# Patient Record
Sex: Female | Born: 1960 | Race: White | Hispanic: No | Marital: Single | Smoking: Current every day smoker
Health system: Southern US, Community
[De-identification: ages and names within clinical notes are randomized; demographics above are authoritative.]

## PROBLEM LIST (undated history)

## (undated) DIAGNOSIS — K219 Gastro-esophageal reflux disease without esophagitis: Secondary | ICD-10-CM

## (undated) DIAGNOSIS — F32A Depression, unspecified: Secondary | ICD-10-CM

## (undated) DIAGNOSIS — F419 Anxiety disorder, unspecified: Secondary | ICD-10-CM

## (undated) DIAGNOSIS — K760 Fatty (change of) liver, not elsewhere classified: Secondary | ICD-10-CM

## (undated) DIAGNOSIS — F329 Major depressive disorder, single episode, unspecified: Secondary | ICD-10-CM

## (undated) DIAGNOSIS — I10 Essential (primary) hypertension: Secondary | ICD-10-CM

## (undated) DIAGNOSIS — E7800 Pure hypercholesterolemia, unspecified: Secondary | ICD-10-CM

## (undated) DIAGNOSIS — J449 Chronic obstructive pulmonary disease, unspecified: Secondary | ICD-10-CM

## (undated) DIAGNOSIS — G4733 Obstructive sleep apnea (adult) (pediatric): Secondary | ICD-10-CM

## (undated) DIAGNOSIS — K635 Polyp of colon: Secondary | ICD-10-CM

## (undated) DIAGNOSIS — K5790 Diverticulosis of intestine, part unspecified, without perforation or abscess without bleeding: Secondary | ICD-10-CM

## (undated) HISTORY — PX: BACK SURGERY: SHX140

## (undated) HISTORY — DX: Fatty (change of) liver, not elsewhere classified: K76.0

## (undated) HISTORY — DX: Chronic obstructive pulmonary disease, unspecified: J44.9

## (undated) HISTORY — DX: Gastro-esophageal reflux disease without esophagitis: K21.9

## (undated) HISTORY — DX: Major depressive disorder, single episode, unspecified: F32.9

## (undated) HISTORY — DX: Depression, unspecified: F32.A

## (undated) HISTORY — DX: Essential (primary) hypertension: I10

## (undated) HISTORY — DX: Anxiety disorder, unspecified: F41.9

## (undated) HISTORY — DX: Diverticulosis of intestine, part unspecified, without perforation or abscess without bleeding: K57.90

## (undated) HISTORY — PX: TONGUE BIOPSY / EXCISION: SUR125

## (undated) HISTORY — PX: ABDOMINAL HYSTERECTOMY: SHX81

## (undated) HISTORY — DX: Polyp of colon: K63.5

## (undated) HISTORY — DX: Obstructive sleep apnea (adult) (pediatric): G47.33

## (undated) HISTORY — DX: Pure hypercholesterolemia, unspecified: E78.00

---

## 1998-03-15 ENCOUNTER — Emergency Department (HOSPITAL_COMMUNITY): Admission: EM | Admit: 1998-03-15 | Discharge: 1998-03-15 | Payer: Self-pay | Admitting: Emergency Medicine

## 2001-10-08 ENCOUNTER — Encounter: Payer: Self-pay | Admitting: Family Medicine

## 2001-10-08 ENCOUNTER — Ambulatory Visit (HOSPITAL_COMMUNITY): Admission: RE | Admit: 2001-10-08 | Discharge: 2001-10-08 | Payer: Self-pay | Admitting: Family Medicine

## 2002-06-08 ENCOUNTER — Inpatient Hospital Stay (HOSPITAL_COMMUNITY): Admission: AC | Admit: 2002-06-08 | Discharge: 2002-07-05 | Payer: Self-pay

## 2002-06-08 ENCOUNTER — Encounter: Payer: Self-pay | Admitting: Emergency Medicine

## 2002-06-09 ENCOUNTER — Encounter: Payer: Self-pay | Admitting: General Surgery

## 2002-06-10 ENCOUNTER — Encounter: Payer: Self-pay | Admitting: Neurosurgery

## 2002-06-11 ENCOUNTER — Encounter: Payer: Self-pay | Admitting: General Surgery

## 2002-06-11 ENCOUNTER — Encounter: Payer: Self-pay | Admitting: Neurosurgery

## 2002-06-11 ENCOUNTER — Encounter: Payer: Self-pay | Admitting: Surgery

## 2002-06-12 ENCOUNTER — Encounter: Payer: Self-pay | Admitting: General Surgery

## 2002-06-13 ENCOUNTER — Encounter: Payer: Self-pay | Admitting: General Surgery

## 2002-06-14 ENCOUNTER — Encounter: Payer: Self-pay | Admitting: General Surgery

## 2002-06-15 ENCOUNTER — Encounter: Payer: Self-pay | Admitting: General Surgery

## 2002-06-16 ENCOUNTER — Encounter: Payer: Self-pay | Admitting: General Surgery

## 2002-06-17 ENCOUNTER — Encounter: Payer: Self-pay | Admitting: General Surgery

## 2002-06-18 ENCOUNTER — Encounter: Payer: Self-pay | Admitting: General Surgery

## 2002-06-19 ENCOUNTER — Encounter: Payer: Self-pay | Admitting: General Surgery

## 2002-06-20 ENCOUNTER — Encounter: Payer: Self-pay | Admitting: General Surgery

## 2002-06-21 ENCOUNTER — Encounter: Payer: Self-pay | Admitting: General Surgery

## 2002-06-22 ENCOUNTER — Encounter: Payer: Self-pay | Admitting: General Surgery

## 2002-06-23 ENCOUNTER — Encounter: Payer: Self-pay | Admitting: General Surgery

## 2002-06-24 ENCOUNTER — Encounter: Payer: Self-pay | Admitting: General Surgery

## 2002-06-25 ENCOUNTER — Encounter: Payer: Self-pay | Admitting: Thoracic Surgery

## 2002-06-26 ENCOUNTER — Encounter: Payer: Self-pay | Admitting: Neurosurgery

## 2002-06-26 ENCOUNTER — Encounter: Payer: Self-pay | Admitting: General Surgery

## 2002-06-28 ENCOUNTER — Encounter: Payer: Self-pay | Admitting: Neurosurgery

## 2002-06-29 ENCOUNTER — Encounter: Payer: Self-pay | Admitting: General Surgery

## 2002-06-30 ENCOUNTER — Encounter: Payer: Self-pay | Admitting: General Surgery

## 2002-07-05 ENCOUNTER — Inpatient Hospital Stay (HOSPITAL_COMMUNITY)
Admission: RE | Admit: 2002-07-05 | Discharge: 2002-07-19 | Payer: Self-pay | Admitting: Physical Medicine & Rehabilitation

## 2002-11-07 ENCOUNTER — Encounter
Admission: RE | Admit: 2002-11-07 | Discharge: 2003-02-05 | Payer: Self-pay | Admitting: Physical Medicine & Rehabilitation

## 2003-01-31 ENCOUNTER — Ambulatory Visit (HOSPITAL_COMMUNITY): Admission: RE | Admit: 2003-01-31 | Discharge: 2003-01-31 | Payer: Self-pay | Admitting: Neurosurgery

## 2003-01-31 ENCOUNTER — Encounter: Payer: Self-pay | Admitting: Neurosurgery

## 2003-02-03 ENCOUNTER — Encounter
Admission: RE | Admit: 2003-02-03 | Discharge: 2003-05-04 | Payer: Self-pay | Admitting: Physical Medicine & Rehabilitation

## 2003-07-16 ENCOUNTER — Encounter
Admission: RE | Admit: 2003-07-16 | Discharge: 2003-10-14 | Payer: Self-pay | Admitting: Physical Medicine & Rehabilitation

## 2003-09-18 ENCOUNTER — Emergency Department (HOSPITAL_COMMUNITY): Admission: EM | Admit: 2003-09-18 | Discharge: 2003-09-18 | Payer: Self-pay | Admitting: Emergency Medicine

## 2003-10-16 ENCOUNTER — Encounter
Admission: RE | Admit: 2003-10-16 | Discharge: 2004-01-14 | Payer: Self-pay | Admitting: Physical Medicine & Rehabilitation

## 2004-02-04 ENCOUNTER — Encounter
Admission: RE | Admit: 2004-02-04 | Discharge: 2004-03-22 | Payer: Self-pay | Admitting: Physical Medicine & Rehabilitation

## 2004-03-22 ENCOUNTER — Encounter
Admission: RE | Admit: 2004-03-22 | Discharge: 2004-06-20 | Payer: Self-pay | Admitting: Physical Medicine & Rehabilitation

## 2004-03-23 ENCOUNTER — Ambulatory Visit: Payer: Self-pay | Admitting: Physical Medicine & Rehabilitation

## 2004-06-21 ENCOUNTER — Encounter
Admission: RE | Admit: 2004-06-21 | Discharge: 2004-09-17 | Payer: Self-pay | Admitting: Physical Medicine & Rehabilitation

## 2004-09-17 ENCOUNTER — Encounter
Admission: RE | Admit: 2004-09-17 | Discharge: 2004-12-16 | Payer: Self-pay | Admitting: Physical Medicine & Rehabilitation

## 2004-11-19 ENCOUNTER — Ambulatory Visit: Payer: Self-pay | Admitting: Physical Medicine & Rehabilitation

## 2005-02-09 ENCOUNTER — Encounter
Admission: RE | Admit: 2005-02-09 | Discharge: 2005-05-10 | Payer: Self-pay | Admitting: Physical Medicine & Rehabilitation

## 2005-02-09 ENCOUNTER — Ambulatory Visit: Payer: Self-pay | Admitting: Physical Medicine & Rehabilitation

## 2005-04-26 ENCOUNTER — Ambulatory Visit: Payer: Self-pay | Admitting: Physical Medicine & Rehabilitation

## 2005-05-02 ENCOUNTER — Ambulatory Visit (HOSPITAL_COMMUNITY)
Admission: RE | Admit: 2005-05-02 | Discharge: 2005-05-02 | Payer: Self-pay | Admitting: Physical Medicine & Rehabilitation

## 2005-07-19 ENCOUNTER — Encounter
Admission: RE | Admit: 2005-07-19 | Discharge: 2005-10-17 | Payer: Self-pay | Admitting: Physical Medicine & Rehabilitation

## 2005-07-19 ENCOUNTER — Ambulatory Visit: Payer: Self-pay | Admitting: Physical Medicine & Rehabilitation

## 2005-10-13 ENCOUNTER — Encounter
Admission: RE | Admit: 2005-10-13 | Discharge: 2006-01-11 | Payer: Self-pay | Admitting: Physical Medicine & Rehabilitation

## 2005-10-25 ENCOUNTER — Ambulatory Visit: Payer: Self-pay | Admitting: Physical Medicine & Rehabilitation

## 2006-03-24 ENCOUNTER — Encounter
Admission: RE | Admit: 2006-03-24 | Discharge: 2006-06-22 | Payer: Self-pay | Admitting: Physical Medicine & Rehabilitation

## 2009-03-07 ENCOUNTER — Emergency Department (HOSPITAL_COMMUNITY): Admission: EM | Admit: 2009-03-07 | Discharge: 2009-03-07 | Payer: Self-pay | Admitting: Emergency Medicine
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
## 2010-11-26 NOTE — Op Note (Signed)
NAMEJASZMINE, [name]                            ACCOUNT NO.:  [id number]   MEDICAL RECORD NO.:  [id number]                   PATIENT TYPE:  INP   LOCATION:  2316                                 FACILITY:  [facility]   PHYSICIAN:  Jimmye Norman III, M.D.               DATE OF BIRTH:  [birth year]   DATE OF PROCEDURE:  06/19/2002  DATE OF DISCHARGE:                                 OPERATIVE REPORT   PREOPERATIVE DIAGNOSES:  Respiratory failure, status post multiple rib  fractures and pneumonia.   POSTOPERATIVE DIAGNOSES:  Respiratory failure, status post multiple rib  fractures and pneumonia.   OPERATION PERFORMED:  1. Bedside percutaneous tracheostomy with a #6 Shiley.  2. Bronchoscopy.  3. Esophagogastroduodenoscopy with percutaneous endoscopic gastrostomy tube.   SURGEON:  Marta Lamas. Lindie Spruce, M.D.   ASSISTANT:  Lazaro Arms, P.A.   ANESTHESIA:  IV sedation with 10 mg of Versed and paralytic of 10 mg of  N_______.   COMPLICATIONS:  None.   CONDITION:  Stable.   INDICATIONS FOR PROCEDURE:  The patient is a 50 year old female chronic  smoker, multiple rib fractures, respiratory failure and pneumonia who now  requires a trach and a G-tube for continued respiratory support and  pulmonary support.   DESCRIPTION OF PROCEDURE:  The patient was done at the bedside.  We did the  percutaneous endoscopic gastrostomy tube and the esophagogastroduodenoscopy  initially.  Using a pediatric scope, we cannulated the upper esophagus after  the patient had received approximately 6 mg of IV Versed.  We passed it into  the proximal esophagus down into the proximal stomach, the gastric portion,  the body, then the antrum and then we went through the pylorus into the  proximal duodenum.  A picture was taken of this area which demonstrated no  evidence of hemorrhage or ulceration.   We pulled the scope back into the proximal stomach which also was free from  ulceration and then I saw on the anterior  wall a palpation from the  assistant surgeon indicating a site for the placement of the catheter.  We  subsequently passed a 14 gauge catheter into the stomach under direct vision  and using a snare, I pulled a double wire out through the patient's mouth  which was looped around the pull through percutaneous endoscopic gastrostomy  tube.  We subsequently made the incision site on the anterior abdominal wall  where the wire came out large enough to pull through the pull through tube  which we brought and secured in place.  We re-endoscoped the patient after  pulling out the tube and showed that the bolster was inside the stomach in  adequate position.   Once this was done, the tube was secured in place in an adequate manner.  Then we went ahead and did the percutaneous tracheostomy.   Prior to the percutaneous tracheostomy, a bronchoscopy was done down the  right and left mainstem bronchi which demonstrated a large amount of very  thin clearish white sputum but no large mucous plugs.  We did not lavage or  send any specimens during the procedure.  However, we did pull the  endotracheal tube back to where it was just below the cords.   We subsequently, prepped the patient's neck with Betadine without putting a  shoulder bump in her.  At this point I made a transverse incision at the  level of the sternum or just above it after anesthetizing it with lidocaine.  We dissected bluntly down to the trachea and then subsequently down to the  pretracheal fascia which was exposed with [employer] retractors.  Hemostasis  was obtained with electrocautery.  Once this was done we punctured between  the second and third tracheal ring with the 14 French angiocatheter  subsequently passing a wire through the angiocatheter into the trachea.  We  then passed a firm dilator over that and then the [facility] Rhino dilator down to  the black ring prior to passing the 26 French introducer inside of the #6  Shiley  catheter into the tracheotomy.  We got excellent CO2 return and  volume return and then secured it in place in the cannula and attaching the  patient to the ventilator.  There was excellent placement of the tube.  Once  it was in place, we secured it in place with 3-0 nylon sutures.  We put a  dressing in place and tracheal ties.  All sponge, needle and instrument  counts were correct.  The patient remained in the room in stable condition.                                               [name], M.D.    JW/MEDQ  D:  06/19/2002  T:  06/19/2002  Job:  [id number]
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
## 2014-01-23 ENCOUNTER — Encounter: Payer: Self-pay | Admitting: Gastroenterology

## 2014-07-24 ENCOUNTER — Encounter (HOSPITAL_COMMUNITY): Payer: Self-pay | Admitting: Emergency Medicine

## 2014-07-24 ENCOUNTER — Emergency Department (HOSPITAL_COMMUNITY): Payer: Medicare Other

## 2014-07-24 ENCOUNTER — Emergency Department (HOSPITAL_COMMUNITY)
Admission: EM | Admit: 2014-07-24 | Discharge: 2014-07-24 | Disposition: A | Discharge status: Home / Self Care | Payer: Medicare Other | Attending: Emergency Medicine | Admitting: Emergency Medicine

## 2014-07-24 DIAGNOSIS — R109 Unspecified abdominal pain: Secondary | ICD-10-CM

## 2014-07-24 DIAGNOSIS — Z720 Tobacco use: Secondary | ICD-10-CM | POA: Insufficient documentation

## 2014-07-24 DIAGNOSIS — K760 Fatty (change of) liver, not elsewhere classified: Secondary | ICD-10-CM | POA: Diagnosis not present

## 2014-07-24 DIAGNOSIS — R1013 Epigastric pain: Secondary | ICD-10-CM | POA: Diagnosis present

## 2014-07-24 LAB — COMPREHENSIVE METABOLIC PANEL WITH GFR
ALT: 24 U/L (ref 0–35)
AST: 17 U/L (ref 0–37)
Albumin: 3.9 g/dL (ref 3.5–5.2)
Alkaline Phosphatase: 91 U/L (ref 39–117)
Anion gap: 7 (ref 5–15)
BUN: 10 mg/dL (ref 6–23)
CO2: 28 mmol/L (ref 19–32)
Calcium: 8.9 mg/dL (ref 8.4–10.5)
Chloride: 103 meq/L (ref 96–112)
Creatinine, Ser: 0.65 mg/dL (ref 0.50–1.10)
GFR calc Af Amer: >90 mL/min
GFR calc non Af Amer: >90 mL/min
Glucose, Bld: 92 mg/dL (ref 70–99)
Potassium: 3.6 mmol/L (ref 3.5–5.1)
Sodium: 138 mmol/L (ref 135–145)
Total Bilirubin: 0.4 mg/dL (ref 0.3–1.2)
Total Protein: 6.9 g/dL (ref 6.0–8.3)

## 2014-07-24 LAB — CBC WITH DIFFERENTIAL/PLATELET
BASOS ABS: 0 10*3/uL (ref 0.0–0.1)
Basophils Relative: 0 % (ref 0–1)
EOS ABS: 0.1 10*3/uL (ref 0.0–0.7)
Eosinophils Relative: 1 % (ref 0–5)
HCT: 40.8 % (ref 36.0–46.0)
Hemoglobin: 13.6 g/dL (ref 12.0–15.0)
LYMPHS ABS: 2.3 10*3/uL (ref 0.7–4.0)
Lymphocytes Relative: 34 % (ref 12–46)
MCH: 30.5 pg (ref 26.0–34.0)
MCHC: 33.3 g/dL (ref 30.0–36.0)
MCV: 91.5 fL (ref 78.0–100.0)
MONO ABS: 0.4 10*3/uL (ref 0.1–1.0)
MONOS PCT: 7 % (ref 3–12)
Neutro Abs: 3.8 10*3/uL (ref 1.7–7.7)
Neutrophils Relative %: 58 % (ref 43–77)
Platelets: 172 10*3/uL (ref 150–400)
RBC: 4.46 MIL/uL (ref 3.87–5.11)
RDW: 13.3 % (ref 11.5–15.5)
WBC: 6.6 10*3/uL (ref 4.0–10.5)

## 2014-07-24 LAB — URINALYSIS, ROUTINE W REFLEX MICROSCOPIC
Bilirubin Urine: NEGATIVE
Glucose, UA: NEGATIVE mg/dL
Hgb urine dipstick: NEGATIVE
Ketones, ur: NEGATIVE mg/dL
Leukocytes, UA: NEGATIVE
Nitrite: NEGATIVE
Protein, ur: NEGATIVE mg/dL
Specific Gravity, Urine: 1.022 (ref 1.005–1.030)
Urobilinogen, UA: 1 mg/dL (ref 0.0–1.0)
pH: 5 (ref 5.0–8.0)

## 2014-07-24 LAB — LIPASE, BLOOD: Lipase: 21 U/L (ref 11–59)

## 2014-07-24 MED ORDER — MORPHINE SULFATE 4 MG/ML IJ SOLN
4.0000 mg | Freq: Once | INTRAMUSCULAR | Status: AC
  Administered 2014-07-24: 4 mg via INTRAVENOUS
  Filled 2014-07-24: qty 1

## 2014-07-24 MED ORDER — IOHEXOL 300 MG/ML  SOLN
50.0000 mL | Freq: Once | INTRAMUSCULAR | Status: AC | PRN
  Administered 2014-07-24: 50 mL via ORAL

## 2014-07-24 MED ORDER — SODIUM CHLORIDE 0.9 % IV BOLUS (SEPSIS)
1000.0000 mL | Freq: Once | INTRAVENOUS | Status: AC
  Administered 2014-07-24: 1000 mL via INTRAVENOUS

## 2014-07-24 MED ORDER — IOHEXOL 300 MG/ML  SOLN
100.0000 mL | Freq: Once | INTRAMUSCULAR | Status: AC | PRN
  Administered 2014-07-24: 100 mL via INTRAVENOUS

## 2014-07-24 NOTE — ED Notes (Signed)
Pt c/o nausea, bloating, lower abdominal pain and tightness, radiating around both sides to her back. Abdomen visibly swollen, pt states her abdomen is clearly swollen, states she is passing bowel movements and gas. Denies emesis.

## 2014-07-24 NOTE — Discharge Instructions (Signed)
Call for a follow up appointment with a Family or Primary Care Provider.  Call a GI specialist for further evaluation of your abdominal discomfort. Return if Symptoms worsen.   Take medication as prescribed.     Emergency Department Resource Guide 1) Find a Doctor and Pay Out of Pocket Although you won't have to find out who is covered by your insurance plan, it is a good idea to ask around and get recommendations. You will then need to call the office and see if the doctor you have chosen will accept you as a new patient and what types of options they offer for patients who are self-pay. Some doctors offer discounts or will set up payment plans for their patients who do not have insurance, but you will need to ask so you aren't surprised when you get to your appointment.  2) Contact Your Local Health Department Not all health departments have doctors that can see patients for sick visits, but many do, so it is worth a call to see if yours does. If you don't know where your local health department is, you can check in your phone book. The CDC also has a tool to help you locate your state's health department, and many state websites also have listings of all of their local health departments.  3) Find a Walk-in Clinic If your illness is not likely to be very severe or complicated, you may want to try a walk in clinic. These are popping up all over the country in pharmacies, drugstores, and shopping centers. They're usually staffed by nurse practitioners or physician assistants that have been trained to treat common illnesses and complaints. They're usually fairly quick and inexpensive. However, if you have serious medical issues or chronic medical problems, these are probably not your best option.  No Primary Care Doctor: - Call Health Connect at  [phone] - they can help you locate a primary care doctor that  accepts your insurance, provides certain services, etc. - Physician Referral Service-  [phone]  Chronic Pain Problems: Organization         Address  Phone   Notes  [facility]  [phone] Patients need to be referred by their primary care doctor.   Medication Assistance: Organization         Address  Phone   Notes  [facility] Medication [facility] [address] [place], [place] [address] [phone] --Must be a resident of [facility] [place] -- Must have NO insurance coverage whatsoever (no Medicaid/ Medicare, etc.) -- The pt. MUST have a primary care doctor that directs their care regularly and follows them in the community   MedAssist  [phone]   [employer]  [phone]    Agencies that provide inexpensive medical care: Organization         Address  Phone   Notes  [facility]  [phone]   [facility]    [phone]   [facility] [address] [place], [place] [address] [phone]   [facility] [address], [place] [phone]   [facility]anned Parenthood    [phone]   [facility]    [phone]   [facility]  [address], [place] Phone:  [phone], Fax:  [phone] Hours of Operation:  9 am - 6 pm, M-F.  Also accepts Medicaid/Medicare and self-pay.  [facility]  [place] for [place], [address], [place] Phone: [phone], Fax: [phone]. Hours of Operation:  8:30 am - 5:30 pm, M-F.  Also accepts Medicaid and self-pay.  [facility] [address] [place] Phone: [phone]   [place], [place], [place] [phone], Ext. [phone] Mondays & Thursdays: 7-9 AM.  First 15 patients are seen on a first come, first serve basis.    [place] Providers:  Organization         Address  Phone   Notes  [facility] [address],  [place] [phone] Also accepts self-pay patients.  [facility] [id number] [place], [place]  [phone]   [place], [address], [place] [phone]   [facility] [address] [place] [phone]   Lucianne Lei [address], [place]   [phone] Only accepts [place] Access [place] patients after they have their name applied to their card.   Self-Pay (no insurance) in [facility]:  Organization         Address  Phone   Notes  Sickle Cell Patients, [facility] [place] [phone]   [facility] [place] [phone]   [facility] [place]  [place], [place], [place] [phone]   [facility]/Dr. Osei-Bonsu  [address], [place] or [address], [place] [phone] Phone number for both [place] and [place] locations is the same.  [facility] [address] [place] [phone]   [facility]  [address], [place] or [address] [place] [address] [phone]   [facility] [address], [place] [phone], phone; [phone], fax Sees patients 1st and 3rd Saturday of every month.  Must not qualify for public or private insurance (i.e. Medicaid, Medicare, [place] Health Choice, Veterans' Benefits)  Household income should be no more than 200% of the poverty level The clinic cannot treat you if you are pregnant or think you are pregnant  Sexually transmitted diseases are not treated at the clinic.    Dental Care: Organization         Address  Phone  Notes  [facility] Department of [facility] [place] [phone] Accepts children up to age 40 who are enrolled in  [place] or [place]; pregnant women with a Medicaid card; and children who have applied for Medicaid or [place] Health Choice, but were declined, whose parents can pay a reduced fee at time of service.  [facility] of [facility]  [address], [place] [phone] Accepts children up to age 85 who are enrolled in [place] or [place]; pregnant women with a Medicaid card; and children who have applied for Medicaid or [place] Health Choice, but were declined, whose parents can pay a reduced fee at time of service.  [place] [facility] Access PROGRAM  [place] [phone] Patients are seen by appointment only. Walk-ins are not accepted. [place] will see patients 62 years of age and older. Monday - Tuesday (8am-5pm) Most Wednesdays (8:30-5pm) $30 per visit,  cash only  [employer] Adult [employer] PROGRAM  [address], [place] [phone] Patients are seen by appointment only. Walk-ins are not accepted. [place] will see patients 68 years of age and older. One Wednesday Evening (Monthly: Volunteer Based).  $30 per visit, cash only  [place]  [phone] for adults; Children under age 32, call Graduate Pediatric Dentistry at [phone]. Children aged 36-14, please call [phone] to request a pediatric application.  Dental services are provided in all areas of dental care including fillings, crowns and bridges, complete and partial dentures, implants, gum treatment, root canals, and extractions. Preventive care is also provided. Treatment is provided to both adults and children. Patients are selected via a lottery and there is often a waiting list.   [facility] [address] [place]  [phone] [email or web address]   [facility] [address] [place], [place] [phone], Ext. [phone] Second and Fourth Thursday of each month, opens at 6:30  AM; Clinic ends at 9 AM.  Patients are seen on a first-come first-served basis, and a limited number are seen during each clinic.   [facility]  [address] [place]er [place], [place] [phone]   Eligibility Requirements You must have lived in [place], [place], or [place] counties for at least the last three months.   You cannot be eligible for state or federal sponsored [employer], including [employer], [place], or [employer].   You generally cannot be eligible for healthcare insurance through your employer.    How to apply: Eligibility screenings are held every Tuesday and Wednesday afternoon from 1:00 pm until 4:00 pm. You do not need an appointment for the interview!  [facility] [address] [place], [place]   [place]  [place] Department  [place]  [phone]    Behavioral Health Resources in the Community: Intensive Outpatient Programs Organization         Address  Phone  Notes  Bejou Flanagan. [address], [place], [place] [phone]   [facility] Outpatient [address] [place], [place]   ADS: Alcohol & Drug Svcs [address] [place], [place]   [place] [address],  [place], [place] or [phone]   Substance Abuse Resources Organization         Address  Phone  Notes  Alcohol and Drug Services  [phone]   [place]  [phone]   [facility] [place]   Chinita Pester  [phone]   Residential & Outpatient Substance Abuse Program  [phone]   Psychological Services Organization         Address  Phone  Notes  [facility] [place]  [place]  [phone]   [place] [address] [place] or  [phone]    Mobile Crisis Teams Organization         Address  Phone  Notes  [facility], Mobile Crisis Care Unit  [phone]   [facility]  [address] [place], [place]   [place] Levels [address], [place] [phone]    Self-Help/Support Groups Organization         Address  Phone  Notes  [facility] [place] - variety of support groups  [place] Call for more information  Narcotics Anonymous (NA), [facility]aring [facility] [address], [employer] [place]  2 meetings at this location   Special educational needs teacher         Address  Phone  Notes  [facility]idential [facility] [place],    [place]  [phone]   [facility]  [address] [place] [id number], [place], [place]   [facility] [place], [place] [phone] Admissions: 8am-3pm M-F  Incentives Substance [place] [address]    [place], [place] [phone]   [facility] [address] [place], [place], [place]   [facility] [address],  [place], [place]   [facility] - Intensive Outpatient [address] Kristeen [address], [place], [place]   [facility] ([place] [address]  [place], [place] [phone] or [phone]   [facility] (RTS) [address], [place], [place] Accepts Medicaid  [facility] [place] [address]  [place] 1-8254049705 Substance Abuse/Addiction Treatment   [facility] Organization         Address  Phone  Notes  [facility]  [phone]   Domenic Schwab, PhD [address] Arlis Porta [place], [place]   [phone] or [phone]   [place], [place] [phone]   [facility] [address],  [place], [place] [phone] Insurance/Medicaid/sponsorship through [facility] and Families [address] [place]                                    [place], [place] [phone] [place] [address] [place], [place] [phone]    Dr. Adele Schilder  [phone]   [facility] Dept. 1) [address] [place] 2) [place] 3)  [place] [address], [place] [phone]  [phone]   [place] [phone] or [phone] (After Hours)

## 2014-07-24 NOTE — ED Notes (Signed)
IV being placed on pt, phlebotomy will not collect

## 2014-07-24 NOTE — ED Provider Notes (Signed)
CSN: [id number]     Arrival date & time 07/24/14  1228 History   First MD Initiated Contact with Patient 07/24/14 1303     Chief Complaint  Patient presents with  . Abdominal Pain     (Consider location/radiation/quality/duration/timing/severity/associated sxs/prior Treatment) HPI Comments: The patient is a 54 year old female presenting to the emergency department chief complaint of abdominal discomfort for several weeks, worsening. Patient reports epigastric discomfort with radiation into bilateral flanks. Patient complains of worsening abdominal distention for the last 2 months. Patient denies vomiting or diarrhea, constipation, hematuria, dysuria. The patient denies heavy alcohol use. Reports abdominal hysterectomy. No other abdominal surgeries.  Patient is a 54 y.o. female presenting with abdominal pain. The history is provided by the patient. No language interpreter was used.  Abdominal Pain Associated symptoms: nausea   Associated symptoms: no chills, no constipation, no diarrhea, no dysuria, no fever, no hematuria and no vomiting     History reviewed. No pertinent past medical history. History reviewed. No pertinent past surgical history. History reviewed. No pertinent family history. History  Substance Use Topics  . Smoking status: Current Every Day Smoker -- 1.00 packs/day    Types: Cigarettes  . Smokeless tobacco: Not on file  . Alcohol Use: No   OB History    No data available     Review of Systems  Constitutional: Negative for fever and chills.  Gastrointestinal: Positive for nausea, abdominal pain and abdominal distention. Negative for vomiting, diarrhea, constipation, blood in stool and anal bleeding.  Genitourinary: Negative for dysuria and hematuria.      Allergies  Chantix and Wellbutrin  Home Medications   Prior to Admission medications   Not on File   BP 132/88 mmHg  Pulse 81  Temp(Src) 98.1 F (36.7 C) (Oral)  Resp 18  SpO2 100% Physical Exam   Constitutional: She is oriented to person, place, and time. She appears well-developed and well-nourished. No distress.  HENT:  Head: Normocephalic and atraumatic.  Neck: Neck supple.  Cardiovascular: Normal rate and regular rhythm.   Pulmonary/Chest: Effort normal. No respiratory distress. She has no wheezes. She has no rales.  Abdominal: Soft. She exhibits distension. She exhibits no mass. There is tenderness in the right upper quadrant, epigastric area and left upper quadrant. There is no rebound, no guarding and negative Murphy's sign.  Neurological: She is alert and oriented to person, place, and time.  Skin: Skin is warm and dry. She is not diaphoretic.  Psychiatric: She has a normal mood and affect. Her behavior is normal.  Nursing note and vitals reviewed.   ED Course  Procedures (including critical care time) Labs Review Labs Reviewed  CBC WITH DIFFERENTIAL  COMPREHENSIVE METABOLIC PANEL  LIPASE, BLOOD  URINALYSIS, ROUTINE W REFLEX MICROSCOPIC    Imaging Review Ct Abdomen Pelvis W Contrast  07/24/2014   CLINICAL DATA:  Nausea, bloating, lower abdominal and pelvic pain  EXAM: CT ABDOMEN AND PELVIS WITH CONTRAST  TECHNIQUE: Multidetector CT imaging of the abdomen and pelvis was performed using the standard protocol following bolus administration of intravenous contrast.  CONTRAST:  100mL OMNIPAQUE IOHEXOL 300 MG/ML  SOLN  COMPARISON:  None.  FINDINGS: Sagittal images of the spine shows no destructive bony lesions. Trace posterior metallic fusion with transpedicular screws and rods T10 L3 level. There is anatomic alignment.  The visualized lung bases are unremarkable.  Heart size within normal limits. No pericardial effusion. Mild hepatic fatty infiltration. No calcified gallstones are noted within gallbladder.  Pancreas, spleen and adrenal  glands are unremarkable. Enhanced kidneys are symmetrical in size. No hydronephrosis or hydroureter.  Delayed renal images shows bilateral renal  symmetrical excretion. Bilateral visualized proximal ureter is unremarkable.  Atherosclerotic calcifications of distal abdominal aorta and iliac arteries. No aortic aneurysm.  There is no small bowel obstruction. No ascites or free air. No adenopathy.  No pericecal inflammation. Normal appendix in right anterior pelvis is clearly visualized in axial image 54.  Contrast material is noted within rectum. Mild distension of the rectum with stool and contrast material. Rectum measures about 7 cm in diameter.  There is no evidence of colitis or diverticulitis. Mild thickening of sigmoid colon wall is probable due to incomplete distension. Nonspecific mild thickening of urinary bladder wall. Clinical correlation is necessary to exclude cystitis.  The uterus and adnexa are not identified.  IMPRESSION: 1. No acute inflammatory process within abdomen. 2. Mild hepatic fatty infiltration. 3. No hydronephrosis or hydroureter. 4. No pericecal inflammation.  Normal appendix. 5. No colonic obstruction. Mild distension of the rectum with contrast material and stool. The rectum measures up to 7 cm in diameter. 6. Nonspecific mild thickening of urinary bladder wall. Mild cystitis cannot be excluded. Clinical correlation is necessary.   Electronically Signed   By: Natasha Mead M.D.   On: 07/24/2014 15:58     EKG Interpretation None      MDM   Final diagnoses:  Abdominal pain  Fatty infiltration of liver   patient presents with epigastric discomfort and abdominal distention for several weeks.  No fluid wave or sign of ascites. Plan to CT given upper abdominal discomfort. CBC, CMP, lipase negative, UA without sign of infection.  3:14 PM Reevaluation patient is not in room  1615 Evaluation patient resting currently in room reports resolution of symptoms. Discussed lab results, imaging results, and treatment plan with the patient. Return precautions given. Reports understanding and no other concerns at this time.  Patient  is stable for discharge at this time.   Mellody Drown, PA-C 07/24/14 1642  Richardean Canal, MD 07/25/14 [phone]

## 2014-08-11 ENCOUNTER — Encounter: Payer: Self-pay | Admitting: *Deleted

## 2014-08-14 ENCOUNTER — Other Ambulatory Visit (INDEPENDENT_AMBULATORY_CARE_PROVIDER_SITE_OTHER): Payer: Medicare Other

## 2014-08-14 ENCOUNTER — Ambulatory Visit (INDEPENDENT_AMBULATORY_CARE_PROVIDER_SITE_OTHER): Payer: Medicare Other | Admitting: Physician Assistant

## 2014-08-14 ENCOUNTER — Encounter: Payer: Self-pay | Admitting: Physician Assistant

## 2014-08-14 VITALS — BP 112/87 | HR 70 | Ht 67.0 in | Wt 148.8 lb

## 2014-08-14 DIAGNOSIS — K219 Gastro-esophageal reflux disease without esophagitis: Secondary | ICD-10-CM

## 2014-08-14 DIAGNOSIS — R1310 Dysphagia, unspecified: Secondary | ICD-10-CM

## 2014-08-14 DIAGNOSIS — R140 Abdominal distension (gaseous): Secondary | ICD-10-CM | POA: Diagnosis not present

## 2014-08-14 DIAGNOSIS — R197 Diarrhea, unspecified: Secondary | ICD-10-CM

## 2014-08-14 DIAGNOSIS — Z800 Family history of malignant neoplasm of digestive organs: Secondary | ICD-10-CM

## 2014-08-14 LAB — CBC WITH DIFFERENTIAL/PLATELET
BASOS ABS: 0 10*3/uL (ref 0.0–0.1)
Basophils Relative: 0.3 % (ref 0.0–3.0)
Eosinophils Absolute: 0.1 10*3/uL (ref 0.0–0.7)
Eosinophils Relative: 1 % (ref 0.0–5.0)
HCT: 40.3 % (ref 36.0–46.0)
Hemoglobin: 14 g/dL (ref 12.0–15.0)
Lymphocytes Relative: 22.3 % (ref 12.0–46.0)
Lymphs Abs: 1.9 10*3/uL (ref 0.7–4.0)
MCHC: 34.6 g/dL (ref 30.0–36.0)
MCV: 88.5 fl (ref 78.0–100.0)
Monocytes Absolute: 0.5 10*3/uL (ref 0.1–1.0)
Monocytes Relative: 5.4 % (ref 3.0–12.0)
Neutro Abs: 6.1 10*3/uL (ref 1.4–7.7)
Neutrophils Relative %: 71 % (ref 43.0–77.0)
Platelets: 208 10*3/uL (ref 150.0–400.0)
RBC: 4.56 Mil/uL (ref 3.87–5.11)
RDW: 13.3 % (ref 11.5–15.5)
WBC: 8.6 10*3/uL (ref 4.0–10.5)

## 2014-08-14 LAB — AMYLASE: Amylase: 18 U/L — ABNORMAL LOW (ref 27–131)

## 2014-08-14 LAB — COMPREHENSIVE METABOLIC PANEL
ALBUMIN: 4.4 g/dL (ref 3.5–5.2)
ALK PHOS: 106 U/L (ref 39–117)
ALT: 51 U/L — ABNORMAL HIGH (ref 0–35)
AST: 44 U/L — ABNORMAL HIGH (ref 0–37)
BUN: 5 mg/dL — ABNORMAL LOW (ref 6–23)
CHLORIDE: 104 meq/L (ref 96–112)
CO2: 26 meq/L (ref 19–32)
Calcium: 9.7 mg/dL (ref 8.4–10.5)
Creatinine, Ser: 0.68 mg/dL (ref 0.40–1.20)
GFR: 95.81 mL/min (ref >60.00)
Glucose, Bld: 91 mg/dL (ref 70–99)
POTASSIUM: 3.9 meq/L (ref 3.5–5.1)
SODIUM: 139 meq/L (ref 135–145)
TOTAL PROTEIN: 7.3 g/dL (ref 6.0–8.3)
Total Bilirubin: 0.5 mg/dL (ref 0.2–1.2)

## 2014-08-14 LAB — LIPASE: Lipase: 4 U/L — ABNORMAL LOW (ref 11.0–59.0)

## 2014-08-14 LAB — URINALYSIS, ROUTINE W REFLEX MICROSCOPIC
Bilirubin Urine: NEGATIVE
Hgb urine dipstick: NEGATIVE
Ketones, ur: NEGATIVE
LEUKOCYTES UA: NEGATIVE
NITRITE: NEGATIVE
PH: 5.5 (ref 5.0–8.0)
SPECIFIC GRAVITY, URINE: 1.025 (ref 1.000–1.030)
Total Protein, Urine: NEGATIVE
Urine Glucose: NEGATIVE
Urobilinogen, UA: 0.2 (ref 0.0–1.0)

## 2014-08-14 LAB — TSH: TSH: 0.38 u[IU]/mL (ref 0.35–4.50)

## 2014-08-14 MED ORDER — PANTOPRAZOLE SODIUM 40 MG PO TBEC: 40.0000 mg | DELAYED_RELEASE_TABLET | Freq: Every day | ORAL | Status: AC

## 2014-08-14 MED ORDER — MOVIPREP 100 G PO SOLR: 1.0000 | Freq: Once | ORAL | Status: DC
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
## 2014-08-14 NOTE — Progress Notes (Signed)
Agree with initial assessment and plans 

## 2014-08-14 NOTE — Addendum Note (Signed)
Addended by: Richardson ChiquitoSMITH, Haylea Schlichting N on: 08/14/2014 12:14 PM   Modules accepted: Orders, Medications

## 2014-08-14 NOTE — Patient Instructions (Addendum)
You have been scheduled for an endoscopy. Please follow written instructions given to you at your visit today. If you use inhalers (even only as needed), please bring them with you on the day of your procedure. Your physician has requested that you go to www.startemmi.com and enter the access code given to you at your visit today. This web site gives a general overview about your procedure. However, you should still follow specific instructions given to you by our office regarding your preparation for the procedure.  Your physician has requested that you go to the basement for the following lab work before leaving today: CBC, CMET, amylase, lipase, TSH, Gi pathogen, lactoferrin, pancreatic fecal elastace, urinalysis  We have sent the following medications to your pharmacy for you to pick up at your convenience: Pantoprazole 40 mg 30 minutes before breakfast  You have been scheduled for a Barium Esophogram at [facility] (1st floor of the hospital) on Wednesday 08/20/14 at 11:30 am. Please arrive 15 minutes prior to your appointment for registration. Make certain not to have anything to eat or drink 6 hours prior to your test. If you need to reschedule for any reason, please contact radiology at [phone] to do so. __________________________________________________________________ A barium swallow is an examination that concentrates on views of the esophagus. This tends to be a double contrast exam (barium and two liquids which, when combined, create a gas to distend the wall of the oesophagus) or single contrast (non-ionic iodine based). The study is usually tailored to your symptoms so a good history is essential. Attention is paid during the study to the form, structure and configuration of the esophagus, looking for functional disorders (such as aspiration, dysphagia, achalasia, motility and reflux) EXAMINATION You may be asked to change into a gown, depending on the type of swallow  being performed. A radiologist and radiographer will perform the procedure. The radiologist will advise you of the type of contrast selected for your procedure and direct you during the exam. You will be asked to stand, sit or lie in several different positions and to hold a small amount of fluid in your mouth before being asked to swallow while the imaging is performed .In some instances you may be asked to swallow barium coated marshmallows to assess the motility of a solid food bolus. The exam can be recorded as a digital or video fluoroscopy procedure. POST PROCEDURE It will take 1-2 days for the barium to pass through your system. To facilitate this, it is important, unless otherwise directed, to increase your fluids for the next 24-48hrs and to resume your normal diet.  This test typically takes about 30 minutes to perform. __________________________________________________________________________________   Please contact [facility] for an gyn appointment. Their information is as follows: Address: [address] [place], [place] [address]  [phone]  You have been scheduled for an appointment with Dr Isabel Caprice on Friday 09/19/14 9:30 am at [facility] for bladder wall thickening, urinary urgency.  Address: [address] [place], [place] [address]  [phone]  We have requested notes from your ENT doctor and from Dr Chales Abrahams.  ON:GEXBMW [place], [facility]  Food Choices for Gastroesophageal Reflux Disease When you have gastroesophageal reflux disease (GERD), the foods you eat and your eating habits are very important. Choosing the right foods can help ease the discomfort of GERD. WHAT GENERAL GUIDELINES DO I NEED TO FOLLOW?  Choose fruits, vegetables, whole grains, low-fat dairy products, and low-fat meat, fish, and poultry.  Limit fats such as oils, salad dressings, butter, nuts, and avocado.  Keep a food diary to identify foods that cause  symptoms.  Avoid foods that cause reflux. These may be different for different people.  Eat frequent small meals instead of three large meals each day.  Eat your meals slowly, in a relaxed setting.  Limit fried foods.  Cook foods using methods other than frying.  Avoid drinking alcohol.  Avoid drinking large amounts of liquids with your meals.  Avoid bending over or lying down until 2-3 hours after eating. WHAT FOODS ARE NOT RECOMMENDED? The following are some foods and drinks that may worsen your symptoms: Vegetables Tomatoes. Tomato juice. Tomato and spaghetti sauce. Chili peppers. Onion and garlic. Horseradish. Fruits Oranges, grapefruit, and lemon (fruit and juice). Meats High-fat meats, fish, and poultry. This includes hot dogs, ribs, ham, sausage, salami, and bacon. Dairy Whole milk and chocolate milk. Sour cream. Cream. Butter. Ice cream. Cream cheese.  Beverages Coffee and tea, with or without caffeine. Carbonated beverages or energy drinks. Condiments Hot sauce. Barbecue sauce.  Sweets/Desserts Chocolate and cocoa. Donuts. Peppermint and spearmint. Fats and Oils High-fat foods, including [place] fries and potato chips. Other Vinegar. Strong spices, such as black pepper, white pepper, red pepper, cayenne, curry powder, cloves, ginger, and chili powder. The items listed above may not be a complete list of foods and beverages to avoid. Contact your dietitian for more information. Document Released: 06/27/2005 Document Revised: 07/02/2013 Document Reviewed: 05/01/2013 [facility] Patient Information 2015 [place], [place]. This information is not intended to replace advice given to you by your health care provider. Make sure you discuss any questions you have with your health care provider.  Gastroesophageal Reflux Disease, Adult Gastroesophageal reflux disease (GERD) happens when acid from your stomach flows up into the esophagus. When acid comes in contact with the  esophagus, the acid causes soreness (inflammation) in the esophagus. Over time, GERD may create small holes (ulcers) in the lining of the esophagus. CAUSES   Increased body weight. This puts pressure on the stomach, making acid rise from the stomach into the esophagus.  Smoking. This increases acid production in the stomach.  Drinking alcohol. This causes decreased pressure in the lower esophageal sphincter (valve or ring of muscle between the esophagus and stomach), allowing acid from the stomach into the esophagus.  Late evening meals and a full stomach. This increases pressure and acid production in the stomach.  A malformed lower esophageal sphincter. Sometimes, no cause is found. SYMPTOMS   Burning pain in the lower part of the mid-chest behind the breastbone and in the mid-stomach area. This may occur twice a week or more often.  Trouble swallowing.  Sore throat.  Dry cough.  Asthma-like symptoms including chest tightness, shortness of breath, or wheezing. DIAGNOSIS  Your caregiver may be able to diagnose GERD based on your symptoms. In some cases, X-rays and other tests may be done to check for complications or to check the condition of your stomach and esophagus. TREATMENT  Your caregiver may recommend over-the-counter or prescription medicines to help decrease acid production. Ask your caregiver before starting or adding any new medicines.  HOME CARE INSTRUCTIONS   Change the factors that you can control. Ask your caregiver for guidance concerning weight loss, quitting smoking, and alcohol consumption.  Avoid foods and drinks that make your symptoms worse, such as:  Caffeine or alcoholic drinks.  Chocolate.  Peppermint or mint flavorings.  Garlic and onions.  Spicy foods.  Citrus fruits,  such as oranges, lemons, or limes.  Tomato-based foods such as sauce, chili, salsa, and pizza.  Fried and fatty foods.  Avoid lying down for the 3 hours prior to your  bedtime or prior to taking a nap.  Eat small, frequent meals instead of large meals.  Wear loose-fitting clothing. Do not wear anything tight around your waist that causes pressure on your stomach.  Raise the head of your bed 6 to 8 inches with wood blocks to help you sleep. Extra pillows will not help.  Only take over-the-counter or prescription medicines for pain, discomfort, or fever as directed by your caregiver.  Do not take aspirin, ibuprofen, or other nonsteroidal anti-inflammatory drugs (NSAIDs). SEEK IMMEDIATE MEDICAL CARE IF:   You have pain in your arms, neck, jaw, teeth, or back.  Your pain increases or changes in intensity or duration.  You develop nausea, vomiting, or sweating (diaphoresis).  You develop shortness of breath, or you faint.  Your vomit is green, yellow, black, or looks like coffee grounds or blood.  Your stool is red, bloody, or black. These symptoms could be signs of other problems, such as heart disease, gastric bleeding, or esophageal bleeding. MAKE SURE YOU:   Understand these instructions.  Will watch your condition.  Will get help right away if you are not doing well or get worse. Document Released: 04/06/2005 Document Revised: 09/19/2011 Document Reviewed: 01/14/2011 [facility] Patient Information 2015 [place], [place]. This information is not intended to replace advice given to you by your health care provider. Make sure you discuss any questions you have with your health care provider.

## 2014-08-15 ENCOUNTER — Other Ambulatory Visit: Payer: Self-pay | Admitting: *Deleted

## 2014-08-15 DIAGNOSIS — R945 Abnormal results of liver function studies: Secondary | ICD-10-CM

## 2014-08-15 DIAGNOSIS — R7989 Other specified abnormal findings of blood chemistry: Secondary | ICD-10-CM

## 2014-08-20 ENCOUNTER — Ambulatory Visit (HOSPITAL_COMMUNITY)
Admission: RE | Admit: 2014-08-20 | Discharge: 2014-08-20 | Disposition: A | Discharge status: Home / Self Care | Payer: Medicare Other | Source: Ambulatory Visit | Attending: Physician Assistant | Admitting: Physician Assistant

## 2014-08-20 DIAGNOSIS — R1310 Dysphagia, unspecified: Secondary | ICD-10-CM

## 2014-08-20 DIAGNOSIS — K219 Gastro-esophageal reflux disease without esophagitis: Secondary | ICD-10-CM

## 2014-08-20 DIAGNOSIS — R140 Abdominal distension (gaseous): Secondary | ICD-10-CM

## 2014-08-21 ENCOUNTER — Encounter: Payer: Self-pay | Admitting: Physician Assistant

## 2014-08-22 ENCOUNTER — Ambulatory Visit (AMBULATORY_SURGERY_CENTER): Payer: Medicare Other | Admitting: Internal Medicine

## 2014-08-22 ENCOUNTER — Other Ambulatory Visit: Payer: Medicare Other

## 2014-08-22 ENCOUNTER — Encounter: Payer: Self-pay | Admitting: Internal Medicine

## 2014-08-22 ENCOUNTER — Encounter: Payer: Self-pay | Admitting: *Deleted

## 2014-08-22 VITALS — BP 139/89 | HR 74 | Temp 96.9°F | Resp 18 | Ht 67.0 in | Wt 148.0 lb

## 2014-08-22 DIAGNOSIS — K219 Gastro-esophageal reflux disease without esophagitis: Secondary | ICD-10-CM | POA: Diagnosis not present

## 2014-08-22 DIAGNOSIS — R140 Abdominal distension (gaseous): Secondary | ICD-10-CM

## 2014-08-22 DIAGNOSIS — R197 Diarrhea, unspecified: Secondary | ICD-10-CM

## 2014-08-22 DIAGNOSIS — R1310 Dysphagia, unspecified: Secondary | ICD-10-CM

## 2014-08-22 DIAGNOSIS — R1013 Epigastric pain: Secondary | ICD-10-CM | POA: Diagnosis not present

## 2014-08-22 DIAGNOSIS — K2980 Duodenitis without bleeding: Secondary | ICD-10-CM

## 2014-08-22 DIAGNOSIS — Z800 Family history of malignant neoplasm of digestive organs: Secondary | ICD-10-CM

## 2014-08-22 MED ORDER — SODIUM CHLORIDE 0.9 % IV SOLN: 500.0000 mL | INTRAVENOUS | Status: DC

## 2014-08-22 NOTE — Patient Instructions (Signed)

## 2014-08-22 NOTE — Progress Notes (Signed)
Called to room to assist during endoscopic procedure.  Patient ID and intended procedure confirmed with present staff. Received instructions for my participation in the procedure from the performing physician.  

## 2014-08-22 NOTE — Progress Notes (Signed)
Report to PACU, RN, vss, BBS= Clear.  

## 2014-08-22 NOTE — Op Note (Signed)
[facility] [address] [place], [id number]   ENDOSCOPY PROCEDURE REPORT  PATIENT: [name], [name]  MR#: [id number] BIRTHDATE: 08/06/1960 , 54  yrs. old GENDER: female ENDOSCOPIST: Roxy CedarJohn N Mikhi Athey Jr, MD REFERRED BY:  Mauricio Poegina [place] ,NP PROCEDURE DATE:  08/22/2014 PROCEDURE:  EGD w/ biopsy for H.pylori ASA CLASS:     Class II INDICATIONS:  epigastric pain, history of esophageal reflux, and dysphagia. MEDICATIONS: Monitored anesthesia care and Propofol 200 mg IV TOPICAL ANESTHETIC: none  DESCRIPTION OF PROCEDURE: After the risks benefits and alternatives of the procedure were thoroughly explained, informed consent was obtained.  The LB [id number] endoscope was introduced through the mouth and advanced to the second portion of the duodenum , Without limitations.  The instrument was slowly withdrawn as the mucosa was fully examined.  The esophagus was normal.  The stomach revealed focal scarring in the mid body but was otherwise normal.  The duodenal bulb revealed mild duodenitis manifested by erythema without erosions.  The post bulbar duodenum was normal.  CLO biopsy taken.  Retroflexed views revealed no abnormalities.     The scope was then withdrawn from the patient and the procedure completed.  COMPLICATIONS: There were no immediate complications.  ENDOSCOPIC AND CLINICAL IMPRESSION: 1. GERD 2. Mild duodenitis status post CLO biopsy 3. IBS 4. Fatty liver  RECOMMENDATIONS: 1.  Stop smoking 2.  Weight loss 3.  Continue pantoprazole 40 mg daily 4. Schedule routine office follow-up with Dr. Marina GoodellPerry in about 3 months  REPEAT EXAM:  eSigned:  Roxy CedarJohn N Richey Doolittle Jr, MD 08/22/2014 3:58 PM    CC:The Patient  ; Mauricio Poegina York, NP

## 2014-08-23 LAB — FECAL LACTOFERRIN, QUANT: Lactoferrin: NEGATIVE

## 2014-08-25 ENCOUNTER — Telehealth: Payer: Self-pay | Admitting: *Deleted

## 2014-08-25 LAB — HELICOBACTER PYLORI SCREEN-BIOPSY: UREASE: NEGATIVE

## 2014-08-25 LAB — GASTROINTESTINAL PATHOGEN PANEL PCR
C. DIFFICILE TOX A/B, PCR: NEGATIVE
Campylobacter, PCR: NEGATIVE
Cryptosporidium, PCR: NEGATIVE
E COLI (ETEC) LT/ST, PCR: NEGATIVE
E coli (STEC) stx1/stx2, PCR: NEGATIVE
E coli 0157, PCR: NEGATIVE
GIARDIA LAMBLIA, PCR: NEGATIVE
Norovirus, PCR: NEGATIVE
Rotavirus A, PCR: NEGATIVE
Salmonella, PCR: NEGATIVE
Shigella, PCR: NEGATIVE

## 2014-08-25 NOTE — Telephone Encounter (Signed)
  Follow up Call-  Call back number 08/22/2014  Post procedure Call Back phone  # [phone]  Permission to leave phone message Yes     Patient questions:  Do you have a fever, pain , or abdominal swelling? No. Pain Score  0 *  Have you tolerated food without any problems? Yes.    Have you been able to return to your normal activities? Yes.    Do you have any questions about your discharge instructions: Diet   No. Medications  No. Follow up visit  No.  Do you have questions or concerns about your Care? No.  Actions: * If pain score is 4 or above: No action needed, pain <4.

## 2014-08-29 NOTE — Progress Notes (Signed)
Of note: [facility]'s last several office visits and biopsy reports since she indicated that she went there. [facility].

## 2014-09-02 ENCOUNTER — Other Ambulatory Visit: Payer: Self-pay | Admitting: *Deleted

## 2014-09-02 LAB — PANCREATIC ELASTASE, FECAL: Pancreatic Elastase-1, Stool: 197 mcg/g — ABNORMAL LOW

## 2014-09-02 MED ORDER — PANCRELIPASE (LIP-PROT-AMYL) 36000-114000 UNITS PO CPEP: ORAL_CAPSULE | ORAL | Status: AC

## 2014-11-21 ENCOUNTER — Ambulatory Visit: Payer: Medicare Other | Admitting: Internal Medicine

## 2016-01-17 IMAGING — CT CT ABD-PELV W/ CM
1 of 3 series · 13 of 32 positions shown, 18 images · IV contrast (OMNIPAQUE 300)
Comparison: None.

CLINICAL DATA: Nausea, bloating, lower abdominal and pelvic pain

EXAM:
CT ABDOMEN AND PELVIS WITH CONTRAST
TECHNIQUE: Multidetector CT imaging of the abdomen and pelvis was performed
using the standard protocol following bolus administration of
intravenous contrast.
CONTRAST:  100mL OMNIPAQUE IOHEXOL 300 MG/ML  SOLN

[Series 3: abd/pel with · axial · 0.74mm/px · z∈[-694,-300]mm · 13 of 89 slices shown, 18 images]
[im 5/89  soft-tissue]
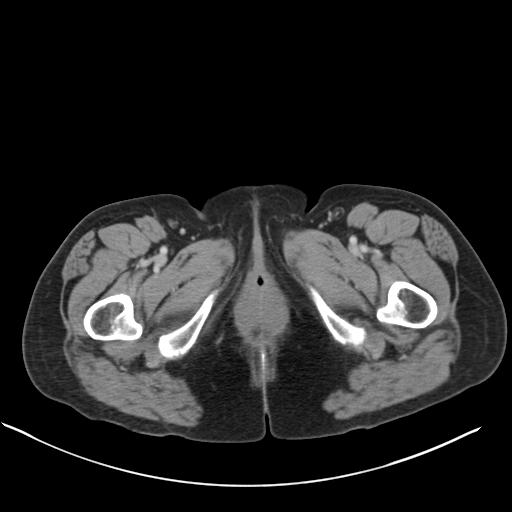
[im 5/89  bone]
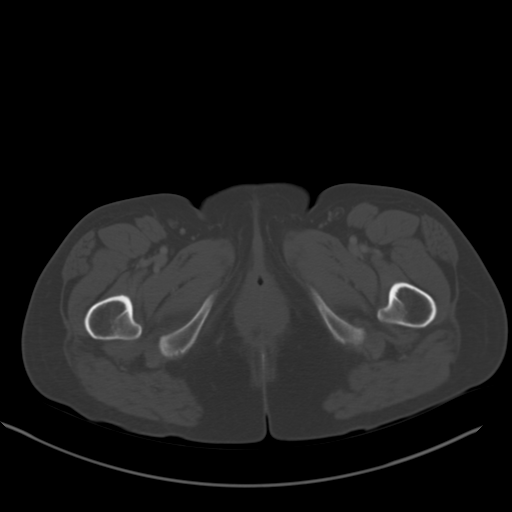
[im 14/89  soft-tissue]
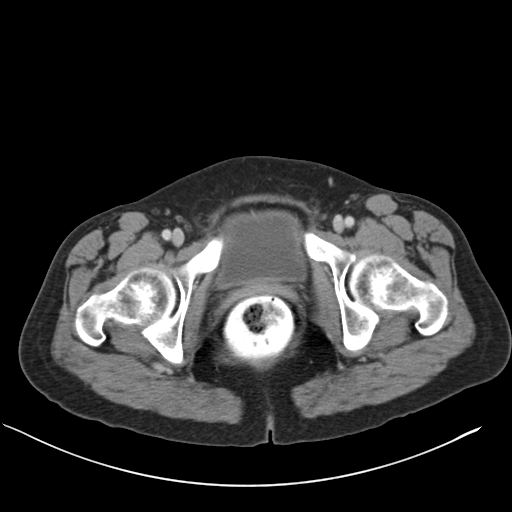
[im 19/89  soft-tissue]
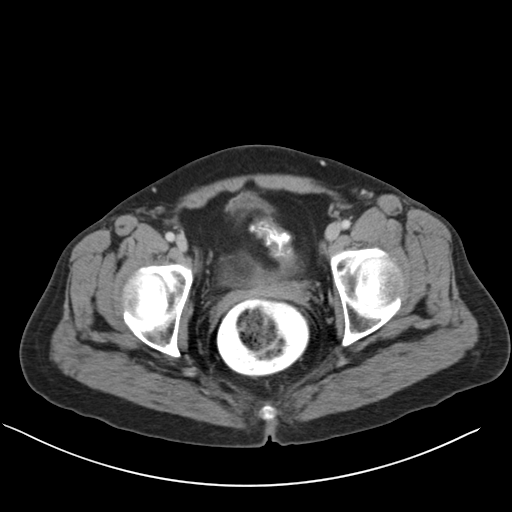
[im 28/89  soft-tissue]
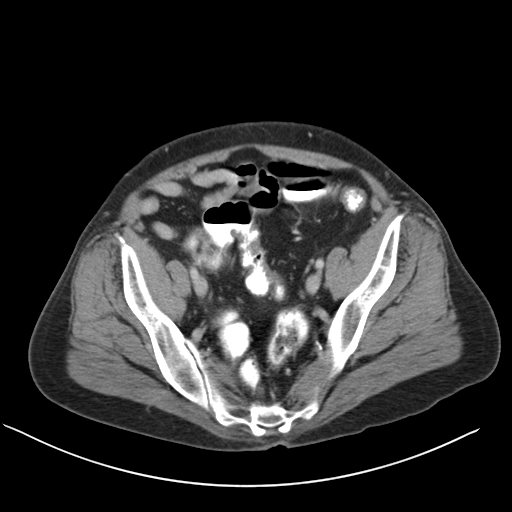
[im 33/89  soft-tissue]
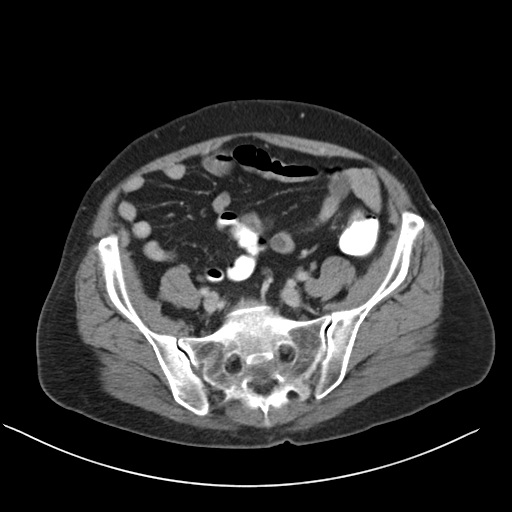
[im 42/89  soft-tissue]
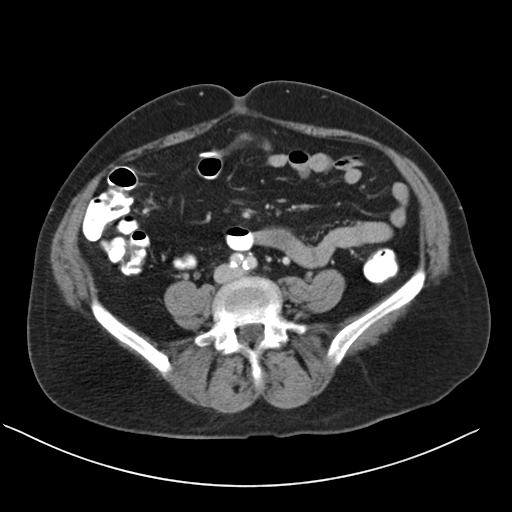
[im 47/89  soft-tissue]
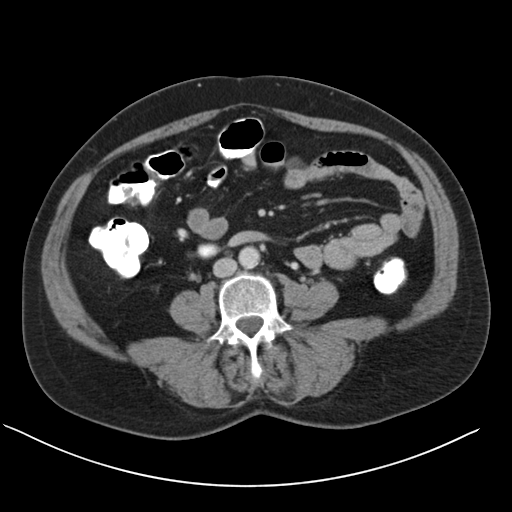
[im 56/89  soft-tissue]
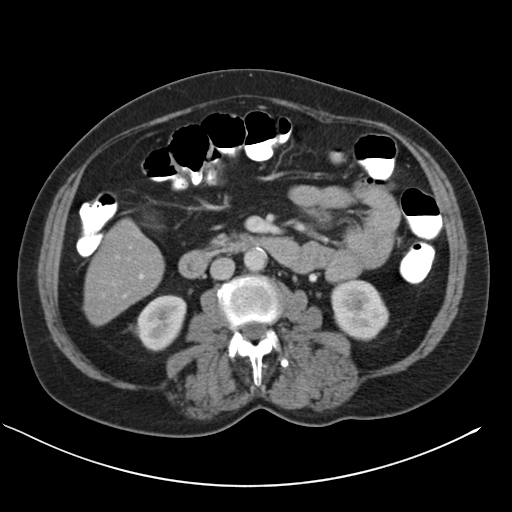
[im 61/89  soft-tissue]
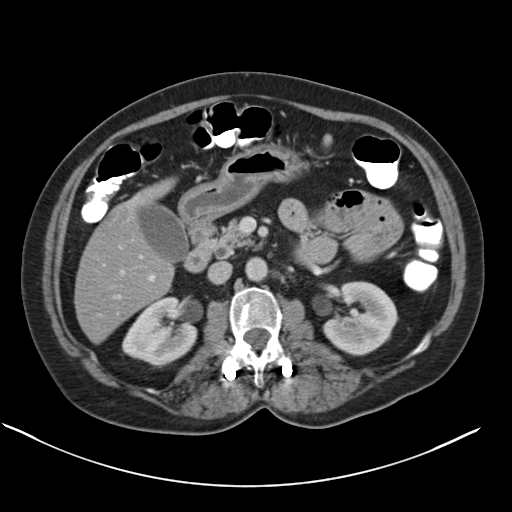
[im 61/89  bone]
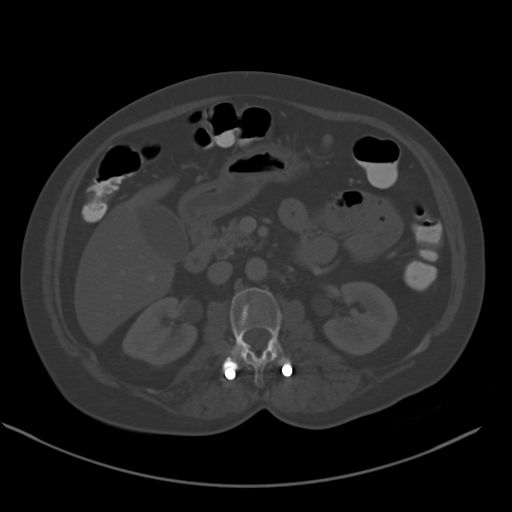
[im 70/89  soft-tissue]
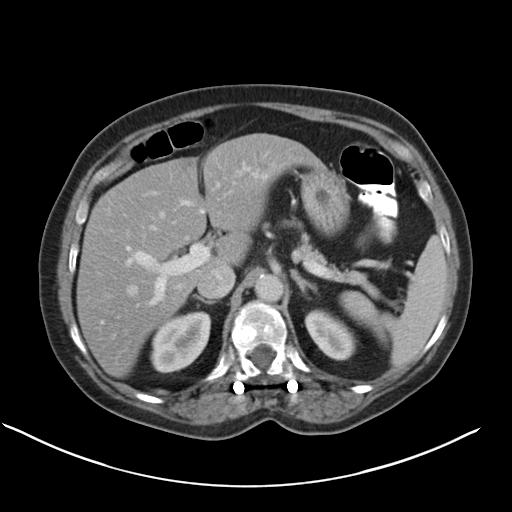
[im 70/89  lung]
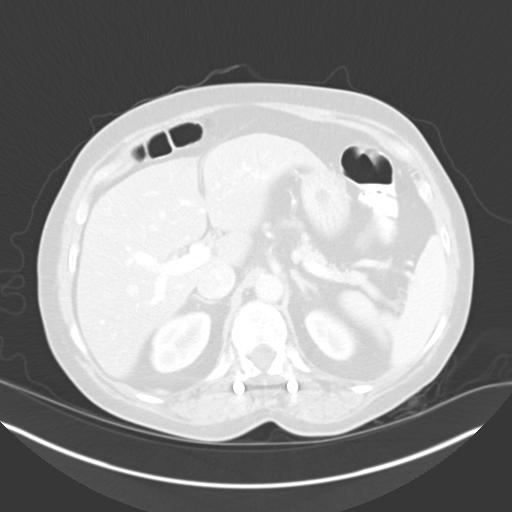
[im 75/89  soft-tissue]
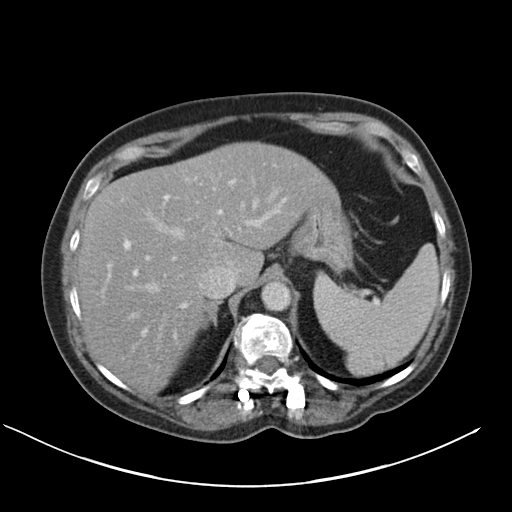
[im 75/89  lung]
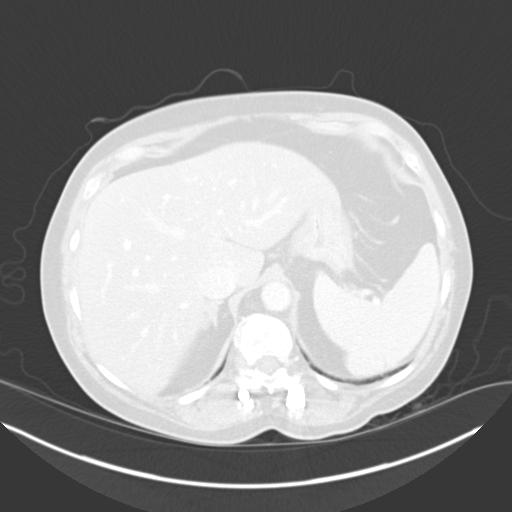
[im 79/89  lung]
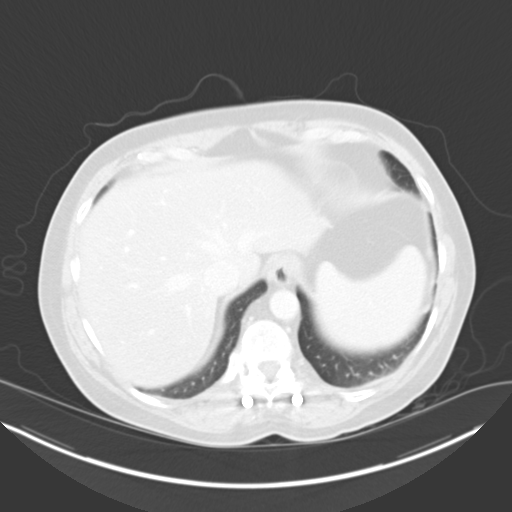
[im 84/89  soft-tissue]
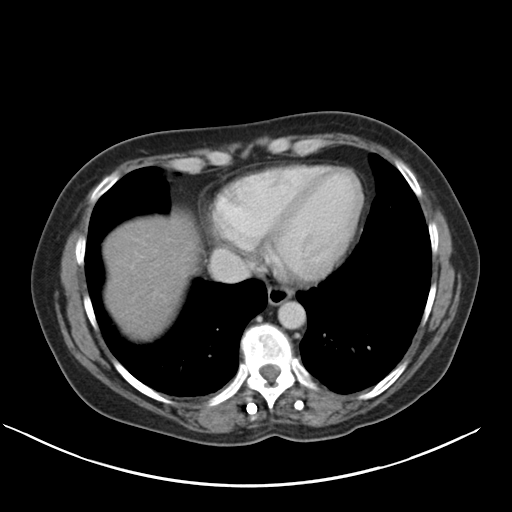
[im 84/89  lung]
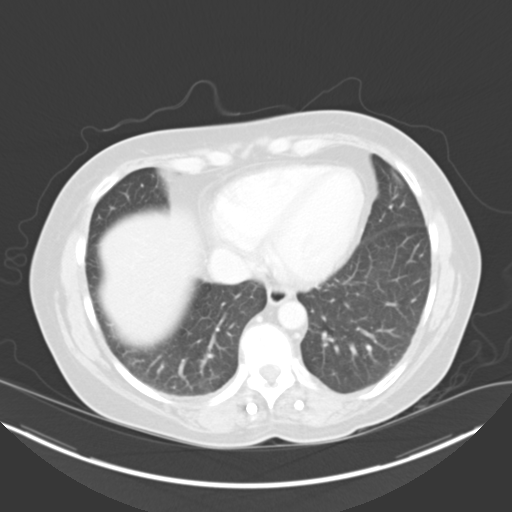

[13 of 32 positions shown; findings below may reference images not displayed]

FINDINGS: Sagittal images of the spine shows no destructive bony lesions.
Trace posterior metallic fusion with transpedicular screws and rods
T10 L3 level. There is anatomic alignment.

The visualized lung bases are unremarkable.

Heart size within normal limits. No pericardial effusion. Mild
hepatic fatty infiltration. No calcified gallstones are noted within
gallbladder.

Pancreas, spleen and adrenal glands are unremarkable. Enhanced
kidneys are symmetrical in size. No hydronephrosis or hydroureter.

Delayed renal images shows bilateral renal symmetrical excretion.
Bilateral visualized proximal ureter is unremarkable.

Atherosclerotic calcifications of distal abdominal aorta and iliac
arteries. No aortic aneurysm.

There is no small bowel obstruction. No ascites or free air. No
adenopathy.

No pericecal inflammation. Normal appendix in right anterior pelvis
is clearly visualized in axial image 54.

Contrast material is noted within rectum. Mild distension of the
rectum with stool and contrast material. Rectum measures about 7 cm
in diameter.

There is no evidence of colitis or diverticulitis. Mild thickening
of sigmoid colon wall is probable due to incomplete distension.
Nonspecific mild thickening of urinary bladder wall. Clinical
correlation is necessary to exclude cystitis.

The uterus and adnexa are not identified.
IMPRESSION: 1. No acute inflammatory process within abdomen.
2. Mild hepatic fatty infiltration.
3. No hydronephrosis or hydroureter.
4. No pericecal inflammation.  Normal appendix.
5. No colonic obstruction. Mild distension of the rectum with
contrast material and stool. The rectum measures up to 7 cm in
diameter.
6. Nonspecific mild thickening of urinary bladder wall. Mild
cystitis cannot be excluded. Clinical correlation is necessary.

## 2019-03-01 ENCOUNTER — Encounter: Payer: Self-pay | Admitting: Gastroenterology

## 2023-07-14 DIAGNOSIS — I6389 Other cerebral infarction: Secondary | ICD-10-CM | POA: Diagnosis not present
